# Patient Record
Sex: Male | Born: 1975 | Race: White | Hispanic: No | Marital: Married | Smoking: Current every day smoker
Health system: Southern US, Community
[De-identification: ages and names within clinical notes are randomized; demographics above are authoritative.]

## PROBLEM LIST (undated history)

## (undated) DIAGNOSIS — G5600 Carpal tunnel syndrome, unspecified upper limb: Secondary | ICD-10-CM

## (undated) DIAGNOSIS — M1990 Unspecified osteoarthritis, unspecified site: Secondary | ICD-10-CM

## (undated) DIAGNOSIS — M719 Bursopathy, unspecified: Secondary | ICD-10-CM

## (undated) DIAGNOSIS — G8929 Other chronic pain: Secondary | ICD-10-CM

## (undated) DIAGNOSIS — M549 Dorsalgia, unspecified: Secondary | ICD-10-CM

---

## 2012-11-29 ENCOUNTER — Emergency Department (HOSPITAL_COMMUNITY)
Admission: EM | Admit: 2012-11-29 | Discharge: 2012-11-29 | Disposition: A | Discharge status: Home / Self Care | Payer: Self-pay | Attending: Emergency Medicine | Admitting: Emergency Medicine

## 2012-11-29 ENCOUNTER — Encounter (HOSPITAL_COMMUNITY): Payer: Self-pay | Admitting: *Deleted

## 2012-11-29 ENCOUNTER — Emergency Department (HOSPITAL_COMMUNITY): Payer: Self-pay

## 2012-11-29 DIAGNOSIS — G5600 Carpal tunnel syndrome, unspecified upper limb: Secondary | ICD-10-CM | POA: Insufficient documentation

## 2012-11-29 DIAGNOSIS — F17200 Nicotine dependence, unspecified, uncomplicated: Secondary | ICD-10-CM | POA: Insufficient documentation

## 2012-11-29 DIAGNOSIS — M79609 Pain in unspecified limb: Secondary | ICD-10-CM | POA: Insufficient documentation

## 2012-11-29 DIAGNOSIS — IMO0002 Reserved for concepts with insufficient information to code with codable children: Secondary | ICD-10-CM | POA: Insufficient documentation

## 2012-11-29 DIAGNOSIS — G8929 Other chronic pain: Secondary | ICD-10-CM | POA: Insufficient documentation

## 2012-11-29 DIAGNOSIS — Z8739 Personal history of other diseases of the musculoskeletal system and connective tissue: Secondary | ICD-10-CM | POA: Insufficient documentation

## 2012-11-29 DIAGNOSIS — M5416 Radiculopathy, lumbar region: Secondary | ICD-10-CM

## 2012-11-29 HISTORY — DX: Other chronic pain: G89.29

## 2012-11-29 HISTORY — DX: Unspecified osteoarthritis, unspecified site: M19.90

## 2012-11-29 HISTORY — DX: Carpal tunnel syndrome, unspecified upper limb: G56.00

## 2012-11-29 HISTORY — DX: Dorsalgia, unspecified: M54.9

## 2012-11-29 HISTORY — DX: Bursopathy, unspecified: M71.9

## 2012-11-29 MED ORDER — IBUPROFEN 800 MG PO TABS
800.0000 mg | ORAL_TABLET | Freq: Once | ORAL | Status: AC
  Administered 2012-11-29: 800 mg via ORAL
  Filled 2012-11-29: qty 1

## 2012-11-29 MED ORDER — CYCLOBENZAPRINE HCL 10 MG PO TABS: 10.0000 mg | ORAL_TABLET | Freq: Three times a day (TID) | ORAL | Status: DC | PRN

## 2012-11-29 MED ORDER — OXYCODONE-ACETAMINOPHEN 5-325 MG PO TABS
1.0000 | ORAL_TABLET | Freq: Once | ORAL | Status: AC
  Administered 2012-11-29: 1 via ORAL
  Filled 2012-11-29: qty 1

## 2012-11-29 MED ORDER — PREDNISONE 10 MG PO TABS: ORAL_TABLET | ORAL | Status: DC

## 2012-11-29 MED ORDER — HYDROCODONE-ACETAMINOPHEN 5-325 MG PO TABS: ORAL_TABLET | ORAL | Status: DC

## 2012-11-29 NOTE — ED Notes (Signed)
Pt reports lower back pain radiating down left leg for past 2- 2 1/2 weeks.  Reports has problems with lower back pain intermittently.  Says pain started after hanging sheet rock.  Denies any bowel or urinary incontinence.

## 2012-11-29 NOTE — ED Notes (Signed)
Pt states that he has hx of chronic lower back due to injury 3-4 years ago, last 4 days pain has increased and radiates down left leg, pt ambulatory to triage with no distress noted, denies any problems with bowel or bladder. Denies any new injury.

## 2012-11-29 NOTE — ED Provider Notes (Signed)
History     CSN: [id number]  Arrival date & time 11/29/12  1031   First MD Initiated Contact with Patient 11/29/12 1123      Chief Complaint  Patient presents with  . Back Pain    (Consider location/radiation/quality/duration/timing/severity/associated sxs/prior treatment) HPI Comments: Patient with hx of chronic low back pain c/o worsening pain to same area for 4 days.  States the pain is radiating into his left buttock and down his left leg to his knee.  He denies incontinence, fever, dysuria, numbness or weakness of the LE's or saddle anesthesia's.  Patient is a 37 y.o. male presenting with back pain. The history is provided by the patient.  Back Pain  This is a chronic problem. The current episode started more than 2 days ago. The problem occurs constantly. The problem has not changed since onset.The pain is associated with no known injury. The pain is present in the lumbar spine. The quality of the pain is described as aching and shooting. The pain radiates to the left thigh and left knee. The pain is moderate. The symptoms are aggravated by bending, twisting and certain positions. Associated symptoms include leg pain. Pertinent negatives include no chest pain, no fever, no numbness, no abdominal pain, no abdominal swelling, no bowel incontinence, no perianal numbness, no bladder incontinence, no dysuria, no pelvic pain, no paresthesias, no paresis, no tingling and no weakness. He has tried NSAIDs for the symptoms. The treatment provided no relief.    Past Medical History  Diagnosis Date  . Back pain, chronic   . Arthritis   . Carpal tunnel syndrome   . Bursitis     History reviewed. No pertinent past surgical history.  No family history on file.  History  Substance Use Topics  . Smoking status: Current Every Day Smoker  . Smokeless tobacco: Not on file  . Alcohol Use: No      Review of Systems  Constitutional: Negative for fever.  Respiratory: Negative for shortness  of breath.   Cardiovascular: Negative for chest pain.  Gastrointestinal: Negative for vomiting, abdominal pain, constipation and bowel incontinence.  Genitourinary: Negative for bladder incontinence, dysuria, hematuria, flank pain, decreased urine volume, discharge, scrotal swelling, difficulty urinating, testicular pain and pelvic pain.       No perineal numbness or incontinence of urine or feces  Musculoskeletal: Positive for back pain. Negative for joint swelling.  Skin: Negative for rash.  Neurological: Negative for dizziness, tingling, weakness, numbness and paresthesias.  All other systems reviewed and are negative.    Allergies  Review of patient's allergies indicates no known allergies.  Home Medications   Current Outpatient Rx  Name  Route  Sig  Dispense  Refill  . IBUPROFEN 200 MG PO TABS   Oral   Take 600 mg by mouth every 6 (six) hours as needed. Pain           BP 123/72  Pulse 77  Temp 98.2 F (36.8 C)  Resp 20  Ht 5\' 11"  (1.803 m)  Wt 155 lb (70.308 kg)  BMI 21.62 kg/m2  SpO2 98%  Physical Exam  Nursing note and vitals reviewed. Constitutional: He is oriented to person, place, and time. He appears well-developed and well-nourished. No distress.  HENT:  Head: Normocephalic and atraumatic.  Neck: Normal range of motion. Neck supple.  Cardiovascular: Normal rate, regular rhythm and intact distal pulses.   No murmur heard. Pulmonary/Chest: Effort normal and breath sounds normal.  Musculoskeletal: He exhibits tenderness. He exhibits  no edema.       Lumbar back: He exhibits tenderness and pain. He exhibits normal range of motion, no bony tenderness, no swelling, no deformity, no laceration and normal pulse.       Back:       ttp of the left lumbar paraspinal muscles and SI joint.  DP pulses are strong and equal bilaterally, distal sensation intact.    Neurological: He is alert and oriented to person, place, and time. No cranial nerve deficit or sensory  deficit. He exhibits normal muscle tone. Coordination and gait normal.  Reflex Scores:      Patellar reflexes are 2+ on the right side and 2+ on the left side.      Achilles reflexes are 2+ on the right side and 2+ on the left side. Skin: Skin is warm and dry.    ED Course  Procedures (including critical care time)  Labs Reviewed - No data to display Dg Lumbar Spine Complete  11/29/2012  *RADIOLOGY REPORT*  Clinical Data: Back pain for 4 years with worsened exacerbation, no known injury  LUMBAR SPINE - COMPLETE 4+ VIEW  Comparison: None.  Findings: There are five non-rib bearing lumbar type vertebral bodies.  There is straightening and slight reversal of the expected lumbar lordosis.  No anterolisthesis or retrolisthesis.  No definite pars defects.  Lumbar vertebral body heights are preserved.  There is very mild multilevel DDD, worse at L2 - L3 and L3 - L4 with disc space height loss, end plate irregularity and sclerosis.  Limited visualization of the bilateral SI joints is normal. Several phleboliths overlie the right hemi pelvis.  Regional bowel gas pattern and soft tissues are normal.  IMPRESSION:  1.  Straightening of the expected lumbar lordosis, nonspecific but may be seen in the setting of muscle spasm. 2.  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   Original Report Authenticated By: Tacey Ruiz, MD         MDM    Patient has ttp of the left lumbar paraspinal muscles and SI joint.  No focal neuro deficits on exam.  Ambulates with a steady gait.  Pain reproducible with SLR on left.  Pt has lumbar radicular pain with intermittent numbness to the left leg, no acute neurological deficits today.  Pt agrees to close f/u with his PMD in [place].     prescribed: Prednisone taper Flexeril Norco #20        Henry Golden L. [place], [place] 12/01/12 2138

## 2012-12-03 NOTE — ED Provider Notes (Signed)
Medical screening examination/treatment/procedure(s) were performed by non-physician practitioner and as supervising physician I was immediately available for consultation/collaboration.   Toshiye Kever, MD 12/03/12 1441 

## 2013-03-27 ENCOUNTER — Encounter (HOSPITAL_COMMUNITY): Payer: Self-pay

## 2013-03-27 ENCOUNTER — Emergency Department (HOSPITAL_COMMUNITY)
Admission: EM | Admit: 2013-03-27 | Discharge: 2013-03-27 | Disposition: A | Discharge status: Home / Self Care | Payer: Self-pay | Attending: Emergency Medicine | Admitting: Emergency Medicine

## 2013-03-27 DIAGNOSIS — M25559 Pain in unspecified hip: Secondary | ICD-10-CM | POA: Insufficient documentation

## 2013-03-27 DIAGNOSIS — Z8739 Personal history of other diseases of the musculoskeletal system and connective tissue: Secondary | ICD-10-CM | POA: Insufficient documentation

## 2013-03-27 DIAGNOSIS — M5430 Sciatica, unspecified side: Secondary | ICD-10-CM | POA: Insufficient documentation

## 2013-03-27 DIAGNOSIS — G8929 Other chronic pain: Secondary | ICD-10-CM | POA: Insufficient documentation

## 2013-03-27 DIAGNOSIS — F17200 Nicotine dependence, unspecified, uncomplicated: Secondary | ICD-10-CM | POA: Insufficient documentation

## 2013-03-27 DIAGNOSIS — M5432 Sciatica, left side: Secondary | ICD-10-CM

## 2013-03-27 DIAGNOSIS — Z8669 Personal history of other diseases of the nervous system and sense organs: Secondary | ICD-10-CM | POA: Insufficient documentation

## 2013-03-27 MED ORDER — KETOROLAC TROMETHAMINE 60 MG/2ML IM SOLN
60.0000 mg | Freq: Once | INTRAMUSCULAR | Status: DC
  Filled 2013-03-27: qty 2

## 2013-03-27 MED ORDER — MELOXICAM 7.5 MG PO TABS: ORAL_TABLET | ORAL | Status: DC

## 2013-03-27 MED ORDER — ACETAMINOPHEN-CODEINE #3 300-30 MG PO TABS: 1.0000 | ORAL_TABLET | Freq: Four times a day (QID) | ORAL | Status: AC | PRN

## 2013-03-27 MED ORDER — DEXAMETHASONE SODIUM PHOSPHATE 4 MG/ML IJ SOLN
8.0000 mg | Freq: Once | INTRAMUSCULAR | Status: AC
  Administered 2013-03-27: 8 mg via INTRAMUSCULAR
  Filled 2013-03-27 (×2): qty 2

## 2013-03-27 MED ORDER — ONDANSETRON HCL 4 MG PO TABS
4.0000 mg | ORAL_TABLET | Freq: Once | ORAL | Status: AC
  Administered 2013-03-27: 4 mg via ORAL
  Filled 2013-03-27: qty 1

## 2013-03-27 MED ORDER — FENTANYL CITRATE 0.05 MG/ML IJ SOLN
100.0000 ug | Freq: Once | INTRAMUSCULAR | Status: AC
  Administered 2013-03-27: 100 ug via INTRAMUSCULAR
  Filled 2013-03-27: qty 2

## 2013-03-27 MED ORDER — KETOROLAC TROMETHAMINE 10 MG PO TABS
10.0000 mg | ORAL_TABLET | Freq: Once | ORAL | Status: AC
  Administered 2013-03-27: 10 mg via ORAL
  Filled 2013-03-27: qty 1

## 2013-03-27 MED ORDER — BACLOFEN 10 MG PO TABS: 10.0000 mg | ORAL_TABLET | Freq: Three times a day (TID) | ORAL | Status: AC

## 2013-03-27 NOTE — ED Provider Notes (Signed)
History     CSN: [id number]  Arrival date & time 03/27/13  0920   First MD Initiated Contact with Patient 03/27/13 [phone]      Chief Complaint  Patient presents with  . Back Pain  . Hip Pain    (Consider location/radiation/quality/duration/timing/severity/associated sxs/prior treatment) Patient is a 37 y.o. male presenting with back pain and hip pain. The history is provided by the patient.  Back Pain Location:  Lumbar spine Quality:  Aching Radiates to: left hip. Pain severity:  Severe Pain is:  Same all the time Onset quality:  Gradual Duration:  1 week Timing:  Constant Progression:  Worsening Chronicity:  Chronic Context comment:  Hx of ddd. No recent accidents or trauma Relieved by:  Nothing Worsened by:  Movement (certain possitions.) Ineffective treatments:  NSAIDs Associated symptoms: no abdominal pain, no bladder incontinence, no bowel incontinence, no chest pain, no dysuria and no perianal numbness   Hip Pain Associated symptoms include arthralgias. Pertinent negatives include no abdominal pain, chest pain, coughing or neck pain.    Past Medical History  Diagnosis Date  . Back pain, chronic   . Arthritis   . Carpal tunnel syndrome   . Bursitis     History reviewed. No pertinent past surgical history.  No family history on file.  History  Substance Use Topics  . Smoking status: Current Every Day Smoker  . Smokeless tobacco: Not on file  . Alcohol Use: No      Review of Systems  Constitutional: Negative for activity change.       All ROS Neg except as noted in HPI  HENT: Negative for nosebleeds and neck pain.   Eyes: Negative for photophobia and discharge.  Respiratory: Negative for cough, shortness of breath and wheezing.   Cardiovascular: Negative for chest pain and palpitations.  Gastrointestinal: Negative for abdominal pain, blood in stool and bowel incontinence.  Genitourinary: Negative for bladder incontinence, dysuria, frequency and  hematuria.  Musculoskeletal: Positive for back pain and arthralgias.  Skin: Negative.   Neurological: Negative for dizziness, seizures and speech difficulty.  Psychiatric/Behavioral: Negative for hallucinations and confusion.    Allergies  Hydrocodone  Home Medications   Current Outpatient Rx  Name  Route  Sig  Dispense  Refill  . ibuprofen (ADVIL,MOTRIN) 200 MG tablet   Oral   Take 600 mg by mouth every 6 (six) hours as needed. Pain         . naproxen (NAPROSYN) 250 MG tablet   Oral   Take 250 mg by mouth 2 (two) times daily with a meal.           BP 130/75  Pulse 74  Temp(Src) 98.5 F (36.9 C) (Oral)  Resp 18  Ht 5\' 11"  (1.803 m)  Wt 155 lb (70.308 kg)  BMI 21.63 kg/m2  SpO2 100%  Physical Exam  Nursing note and vitals reviewed. Constitutional: He is oriented to person, place, and time. He appears well-developed and well-nourished.  Non-toxic appearance.  HENT:  Head: Normocephalic.  Right Ear: Tympanic membrane and external ear normal.  Left Ear: Tympanic membrane and external ear normal.  Eyes: EOM and lids are normal. Pupils are equal, round, and reactive to light.  Neck: Normal range of motion. Neck supple. Carotid bruit is not present.  Cardiovascular: Normal rate, regular rhythm, normal heart sounds, intact distal pulses and normal pulses.   Pulmonary/Chest: Breath sounds normal. No respiratory distress.  Abdominal: Soft. Bowel sounds are normal. There is no tenderness. There is  no guarding.  Musculoskeletal: Normal range of motion.  Lumbar area pain with sitting and worse with SLR at 40 degrees. No hot areas of the pack. No palpable step off.  Lymphadenopathy:       Head (right side): No submandibular adenopathy present.       Head (left side): No submandibular adenopathy present.    He has no cervical adenopathy.  Neurological: He is alert and oriented to person, place, and time. He has normal strength. No cranial nerve deficit or sensory deficit.   No motor or sensory deficits.  Skin: Skin is warm and dry.  Psychiatric: He has a normal mood and affect. His speech is normal.    ED Course  Procedures (including critical care time)  Labs Reviewed - No data to display No results found.   No diagnosis found.    MDM  I have reviewed nursing notes, vital signs, and all appropriate lab and imaging results for this patient. Pt has chronic back problem that worsened nearly 1 week ago. Pt has been taking motrin and naproxyn which usually makes the pain livable, but not working this time. Pt also has pain going down to the hip and leg on the left. No loss of bowel or bladder function.  Plan - Rx for baclofen, tylenol #3 and mobic given to the patient. Pt referred to Dr Ave Filter for evaluation of the extending back problem.       Kathie Dike, PA-C 03/27/13 1054

## 2013-03-27 NOTE — ED Notes (Signed)
Pt c/o chronic pain in lower back, left hip, and radiating into left leg.  Reports pain has been worse since Tuesday.

## 2013-03-30 NOTE — ED Provider Notes (Signed)
Medical screening examination/treatment/procedure(s) were performed by non-physician practitioner and as supervising physician I was immediately available for consultation/collaboration.   Jeily Guthridge M Graycen Degan, DO 03/30/13 1113 

## 2013-04-04 ENCOUNTER — Other Ambulatory Visit (HOSPITAL_COMMUNITY): Payer: Self-pay | Admitting: Family Medicine

## 2013-04-04 DIAGNOSIS — R52 Pain, unspecified: Secondary | ICD-10-CM

## 2013-04-10 ENCOUNTER — Ambulatory Visit (HOSPITAL_COMMUNITY)
Admission: RE | Admit: 2013-04-10 | Discharge: 2013-04-10 | Disposition: A | Discharge status: Home / Self Care | Payer: Self-pay | Source: Ambulatory Visit | Attending: Family Medicine | Admitting: Family Medicine

## 2013-04-10 DIAGNOSIS — R52 Pain, unspecified: Secondary | ICD-10-CM

## 2013-04-11 ENCOUNTER — Ambulatory Visit (HOSPITAL_COMMUNITY)
Admission: RE | Admit: 2013-04-11 | Discharge: 2013-04-11 | Disposition: A | Discharge status: Home / Self Care | Payer: Self-pay | Source: Ambulatory Visit | Attending: Family Medicine | Admitting: Family Medicine

## 2013-04-11 DIAGNOSIS — M5126 Other intervertebral disc displacement, lumbar region: Secondary | ICD-10-CM | POA: Insufficient documentation

## 2013-04-11 DIAGNOSIS — M545 Low back pain, unspecified: Secondary | ICD-10-CM | POA: Insufficient documentation

## 2013-04-17 ENCOUNTER — Other Ambulatory Visit: Payer: Self-pay | Admitting: Family Medicine

## 2013-04-17 DIAGNOSIS — M549 Dorsalgia, unspecified: Secondary | ICD-10-CM

## 2013-04-25 ENCOUNTER — Ambulatory Visit
Admission: RE | Admit: 2013-04-25 | Discharge: 2013-04-25 | Disposition: A | Discharge status: Home / Self Care | Payer: No Typology Code available for payment source | Source: Ambulatory Visit | Attending: Family Medicine | Admitting: Family Medicine

## 2013-04-25 ENCOUNTER — Other Ambulatory Visit: Payer: Self-pay | Admitting: Family Medicine

## 2013-04-25 DIAGNOSIS — M549 Dorsalgia, unspecified: Secondary | ICD-10-CM

## 2013-04-25 MED ORDER — METHYLPREDNISOLONE ACETATE 40 MG/ML INJ SUSP (RADIOLOG
120.0000 mg | Freq: Once | INTRAMUSCULAR | Status: AC
  Administered 2013-04-25: 120 mg via EPIDURAL

## 2013-04-25 MED ORDER — IOHEXOL 180 MG/ML  SOLN
1.0000 mL | Freq: Once | INTRAMUSCULAR | Status: AC | PRN
  Administered 2013-04-25: 1 mL via EPIDURAL

## 2014-09-10 ENCOUNTER — Other Ambulatory Visit: Payer: Self-pay | Admitting: Family Medicine

## 2014-09-10 DIAGNOSIS — M545 Low back pain: Principal | ICD-10-CM

## 2014-09-10 DIAGNOSIS — G8929 Other chronic pain: Secondary | ICD-10-CM

## 2014-09-10 DIAGNOSIS — M5136 Other intervertebral disc degeneration, lumbar region: Secondary | ICD-10-CM

## 2014-09-12 ENCOUNTER — Ambulatory Visit
Admission: RE | Admit: 2014-09-12 | Discharge: 2014-09-12 | Disposition: A | Discharge status: Home / Self Care | Payer: No Typology Code available for payment source | Source: Ambulatory Visit | Attending: Family Medicine | Admitting: Family Medicine

## 2014-09-12 ENCOUNTER — Other Ambulatory Visit: Payer: Self-pay | Admitting: Family Medicine

## 2014-09-12 DIAGNOSIS — M5136 Other intervertebral disc degeneration, lumbar region: Secondary | ICD-10-CM

## 2014-09-12 DIAGNOSIS — M545 Low back pain, unspecified: Secondary | ICD-10-CM

## 2014-09-12 DIAGNOSIS — G8929 Other chronic pain: Secondary | ICD-10-CM

## 2014-09-12 MED ORDER — IOHEXOL 180 MG/ML  SOLN
1.0000 mL | Freq: Once | INTRAMUSCULAR | Status: AC | PRN
  Administered 2014-09-12: 1 mL via EPIDURAL

## 2014-09-12 MED ORDER — METHYLPREDNISOLONE ACETATE 40 MG/ML INJ SUSP (RADIOLOG
120.0000 mg | Freq: Once | INTRAMUSCULAR | Status: AC
  Administered 2014-09-12: 120 mg via EPIDURAL

## 2014-09-12 NOTE — Discharge Instructions (Signed)

## 2016-01-26 ENCOUNTER — Other Ambulatory Visit: Payer: Self-pay | Admitting: Family Medicine

## 2016-01-26 DIAGNOSIS — G8929 Other chronic pain: Secondary | ICD-10-CM

## 2016-01-26 DIAGNOSIS — M545 Low back pain: Principal | ICD-10-CM

## 2016-02-03 ENCOUNTER — Ambulatory Visit
Admission: RE | Admit: 2016-02-03 | Discharge: 2016-02-03 | Disposition: A | Discharge status: Home / Self Care | Payer: No Typology Code available for payment source | Source: Ambulatory Visit | Attending: Family Medicine | Admitting: Family Medicine

## 2016-02-03 DIAGNOSIS — M545 Low back pain, unspecified: Secondary | ICD-10-CM

## 2016-02-03 DIAGNOSIS — G8929 Other chronic pain: Secondary | ICD-10-CM

## 2016-02-03 MED ORDER — IOHEXOL 180 MG/ML  SOLN
1.0000 mL | Freq: Once | INTRAMUSCULAR | Status: AC | PRN
  Administered 2016-02-03: 1 mL via EPIDURAL

## 2016-02-03 MED ORDER — METHYLPREDNISOLONE ACETATE 40 MG/ML INJ SUSP (RADIOLOG
120.0000 mg | Freq: Once | INTRAMUSCULAR | Status: AC
  Administered 2016-02-03: 120 mg via EPIDURAL

## 2016-02-03 NOTE — Discharge Instructions (Signed)

## 2017-01-05 ENCOUNTER — Other Ambulatory Visit: Payer: Self-pay | Admitting: Family Medicine

## 2017-01-05 DIAGNOSIS — G8929 Other chronic pain: Secondary | ICD-10-CM

## 2017-01-05 DIAGNOSIS — M545 Low back pain: Principal | ICD-10-CM

## 2020-05-07 ENCOUNTER — Emergency Department (HOSPITAL_COMMUNITY)
Admission: EM | Admit: 2020-05-07 | Discharge: 2020-05-07 | Disposition: A | Discharge status: Home / Self Care | Payer: Self-pay | Attending: Emergency Medicine | Admitting: Emergency Medicine

## 2020-05-07 ENCOUNTER — Emergency Department (HOSPITAL_COMMUNITY): Payer: Self-pay

## 2020-05-07 ENCOUNTER — Encounter (HOSPITAL_COMMUNITY): Payer: Self-pay | Admitting: *Deleted

## 2020-05-07 ENCOUNTER — Other Ambulatory Visit: Payer: Self-pay

## 2020-05-07 DIAGNOSIS — F17200 Nicotine dependence, unspecified, uncomplicated: Secondary | ICD-10-CM | POA: Insufficient documentation

## 2020-05-07 DIAGNOSIS — M1712 Unilateral primary osteoarthritis, left knee: Secondary | ICD-10-CM | POA: Insufficient documentation

## 2020-05-07 MED ORDER — MELOXICAM 7.5 MG PO TABS
7.5000 mg | ORAL_TABLET | Freq: Every day | ORAL | 0 refills | Status: AC
Start: 2020-05-07 — End: 2020-05-17

## 2020-05-07 NOTE — Discharge Instructions (Addendum)
Take meloxicam daily as prescribed with food. Alternate warm and cold compresses to the knee. Follow-up with orthopedics if pain persist.

## 2020-05-07 NOTE — ED Triage Notes (Signed)
Left knee pain x 4 days.

## 2020-05-07 NOTE — ED Provider Notes (Signed)
[facility] EMERGENCY DEPARTMENT Provider Note   CSN: [id number] Arrival date & time: 05/07/20  0930     History Chief Complaint  Patient presents with  . Knee Pain    Percy Winterrowd is a 44 y.o. male.  44yo male with complaint of left knee pain. Patient states he was working the other day when his knee locked up on him, unable to move the knee without pain. Patient was able to work this out and is able to flex and extend the knee at this time however reports ongoing aching pain in the posterior knee. Patient has been wearing a knee sleeve with some relief. Denies swelling, no recent or remote injuries to this knee. No other complaints or concerns.         Past Medical History:  Diagnosis Date  . Arthritis   . Back pain, chronic   . Bursitis   . Carpal tunnel syndrome     There are no problems to display for this patient.   History reviewed. No pertinent surgical history.     No family history on file.  Social History   Tobacco Use  . Smoking status: Current Every Day Smoker  . Smokeless tobacco: Never Used  Substance Use Topics  . Alcohol use: No  . Drug use: No    Home Medications Prior to Admission medications   Medication Sig Start Date End Date Taking? Authorizing Provider  acetaminophen-codeine (TYLENOL #3) 300-30 MG per tablet Take 1-2 tablets by mouth every 6 (six) hours as needed for pain. 03/27/13   Ivery Quale, PA-C  baclofen (LIORESAL) 10 MG tablet Take 1 tablet (10 mg total) by mouth 3 (three) times daily. 03/27/13   Ivery Quale, PA-C  meloxicam (MOBIC) 7.5 MG tablet Take 1 tablet (7.5 mg total) by mouth daily for 10 days. 05/07/20 05/17/20  Jeannie Fend, PA-C    Allergies    Hydrocodone  Review of Systems   Review of Systems  Constitutional: Negative for fever.  Musculoskeletal: Positive for arthralgias and myalgias. Negative for gait problem and joint swelling.  Skin: Negative for color change, rash and wound.  Allergic/Immunologic:  Negative for immunocompromised state.  Neurological: Negative for weakness and numbness.    Physical Exam Updated Vital Signs BP 113/86   Pulse 71   Temp 98 F (36.7 C)   Resp 18   Ht 5' 10.5" (1.791 m)   Wt 79.4 kg   SpO2 100%   BMI 24.75 kg/m   Physical Exam Vitals and nursing note reviewed.  Constitutional:      General: He is not in acute distress.    Appearance: He is well-developed. He is not diaphoretic.  HENT:     Head: Normocephalic and atraumatic.  Cardiovascular:     Pulses: Normal pulses.  Pulmonary:     Effort: Pulmonary effort is normal.  Musculoskeletal:        General: Tenderness present. No swelling or deformity. Normal range of motion.       Legs:     Comments: Mild tenderness to posterior left knee, no calf swelling or tenderness. No erythema, no effusion. Minimal joint line tenderness.   Skin:    General: Skin is warm and dry.     Capillary Refill: Capillary refill takes less than 2 seconds.     Findings: No erythema or rash.  Neurological:     Mental Status: He is alert and oriented to person, place, and time.     Sensory: No sensory deficit.  Psychiatric:        Behavior: Behavior normal.     ED Results / Procedures / Treatments   Labs (all labs ordered are listed, but only abnormal results are displayed) Labs Reviewed - No data to display  EKG None  Radiology DG Knee Complete 4 Views Left  Result Date: 05/07/2020 CLINICAL DATA:  Felt a pop in LEFT knee while working 2 days ago, pain and limited range of motion since, works in [employer], on his feet a lot, initial encounter EXAM: LEFT KNEE - COMPLETE 4+ VIEW COMPARISON:  None FINDINGS: Osseous mineralization normal. Mild medial compartment joint space narrowing. No acute fracture, dislocation, or bone destruction. No joint effusion. IMPRESSION: Minimal degenerative changes at. No acute abnormalities. Electronically Signed   By: Lavonia Dana M.D.   On: 05/07/2020 11:40     Procedures Procedures (including critical care time)  Medications Ordered in ED Medications - No data to display  ED Course  I have reviewed the triage vital signs and the nursing notes.  Pertinent labs & imaging results that were available during my care of the patient were reviewed by me and considered in my medical decision making (see chart for details).  Clinical Course as of May 07 1200  Thu May 08, 8359  3035 44 year old male with complaint of left knee pain as documented above.  On exam does not have any lower extremity swelling no calf tenderness to suspect DVT.  Has minimal joint line tenderness, does have mild tenderness to the posterior left knee.  X-ray shows mild osteoarthritis.  Patient will continue to wear sleeve as needed for comfort, will place on daily course of meloxicam x10 days and recommend follow-up with orthopedics if not improving.  Discussed history of feeling like his knee was locked, discussed this may be related to a meniscus injury which is not visible on x-ray and best to further evaluate by orthopedics.   [LM]    Clinical Course User Index [LM] Roque Lias   MDM Rules/Calculators/A&P                          Final Clinical Impression(s) / ED Diagnoses Final diagnoses:  Osteoarthritis of left knee, unspecified osteoarthritis type    Rx / DC Orders ED Discharge Orders         Ordered    meloxicam (MOBIC) 7.5 MG tablet  Daily     Discontinue  Reprint     05/07/20 1153           Tacy Learn, PA-C 05/07/20 1201    Daleen Bo, MD 05/07/20 1705

## 2020-05-26 ENCOUNTER — Telehealth: Payer: Self-pay | Admitting: Orthopedic Surgery

## 2020-05-26 NOTE — Telephone Encounter (Signed)
Patient inquiring about fees for self-pay following emergency room visit on 05/07/20. Discussed and offered appointment. Patient will call back when ready to schedule.

## 2021-11-24 ENCOUNTER — Other Ambulatory Visit: Payer: Self-pay | Admitting: Neurosurgery

## 2021-11-24 ENCOUNTER — Other Ambulatory Visit (HOSPITAL_COMMUNITY): Payer: Self-pay | Admitting: Neurosurgery

## 2021-11-24 DIAGNOSIS — M5440 Lumbago with sciatica, unspecified side: Secondary | ICD-10-CM

## 2021-12-07 ENCOUNTER — Ambulatory Visit (HOSPITAL_COMMUNITY)
Admission: RE | Admit: 2021-12-07 | Discharge: 2021-12-07 | Disposition: A | Discharge status: Home / Self Care | Payer: Self-pay | Source: Ambulatory Visit | Attending: Neurosurgery | Admitting: Neurosurgery

## 2021-12-07 ENCOUNTER — Other Ambulatory Visit: Payer: Self-pay

## 2021-12-07 DIAGNOSIS — M5440 Lumbago with sciatica, unspecified side: Secondary | ICD-10-CM | POA: Insufficient documentation

## 2022-06-30 IMAGING — MR MR LUMBAR SPINE W/O CM
5 series · 32 of 48 positions shown · non-contrast
Comparison: Lumbar MRI 04/11/2013. Lumbar spine radiographs
11/29/2012 and 10/27/2021.

CLINICAL DATA: Chronic low back pain.  No known injury.

EXAM:
MRI LUMBAR SPINE WITHOUT CONTRAST
TECHNIQUE: Multiplanar, multisequence MR imaging of the lumbar spine was
performed. No intravenous contrast was administered.

[Series 5: STIR · sagittal · 4.0mm · 0.55mm/px · 2 of 16 slices shown]
[im 1/16]
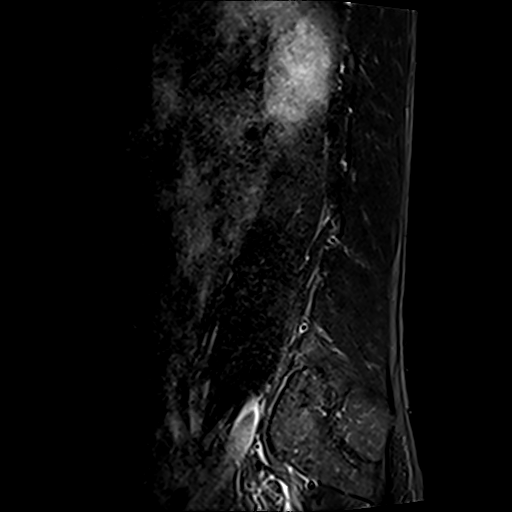
[im 4/16]
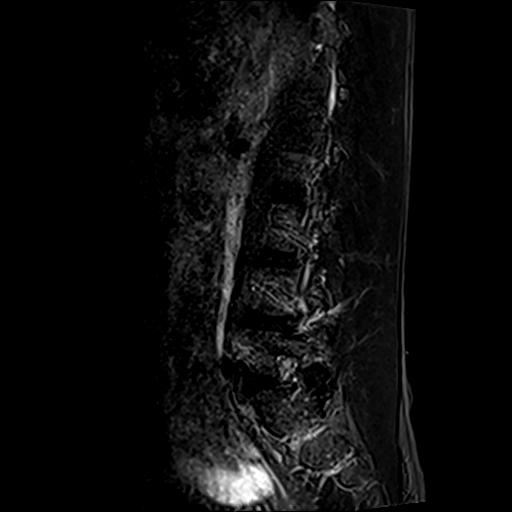

[Series 6: T2 · sagittal · 4.0mm · 0.88mm/px · 6 of 16 slices shown (1 of 2)]
[im 1/16]
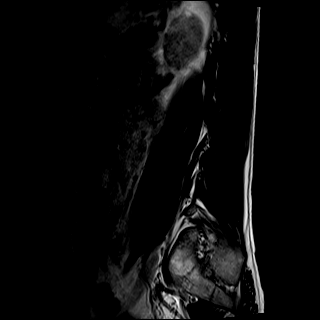
[im 4/16]
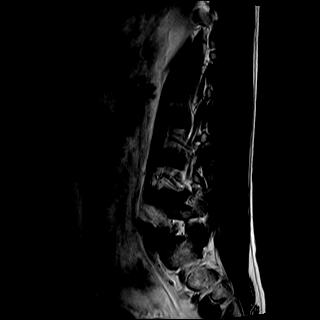
[im 7/16]
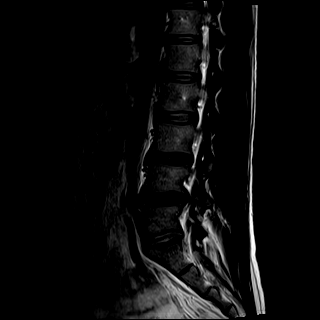
[im 10/16]
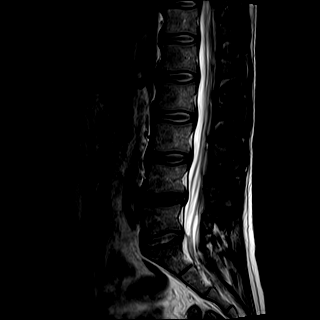
[im 13/16]
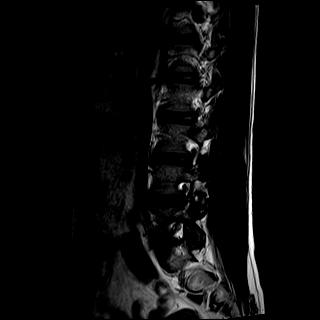
[im 16/16]
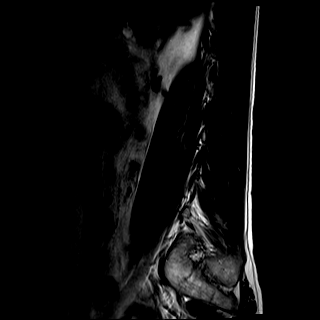

[Series 7: T1 · sagittal · 4.0mm · 1.09mm/px · 6 of 16 slices shown (1 of 2)]
[im 1/16]
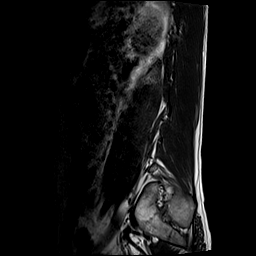
[im 4/16]
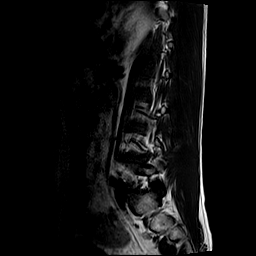
[im 7/16]
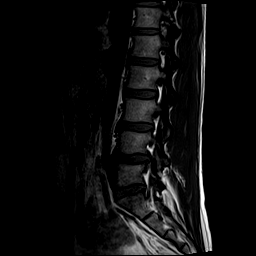
[im 10/16]
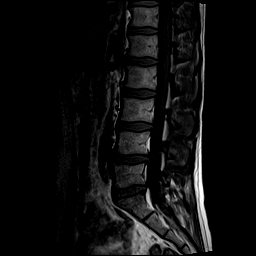
[im 13/16]
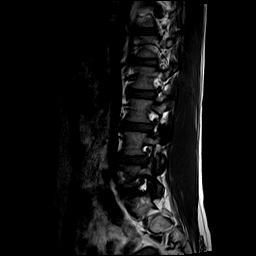
[im 16/16]
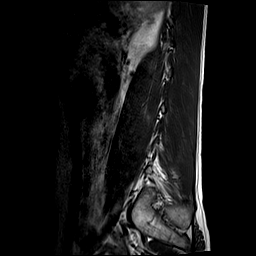

[Series 8: T2 · axial · 4.0mm · 0.70mm/px · z∈[-165,+44]mm · 9 of 37 slices shown (2 of 2)]
[im 1/37]
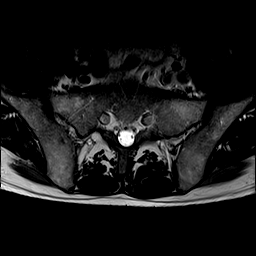
[im 6/37]
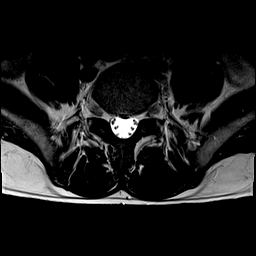
[im 11/37]
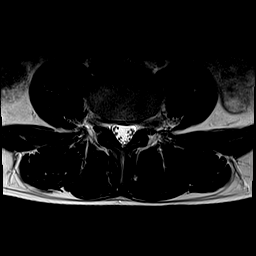
[im 16/37]
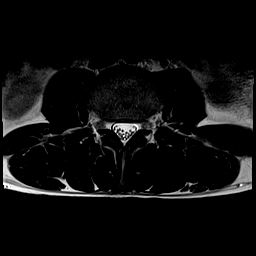
[im 19/37]
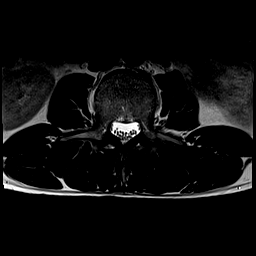
[im 21/37]
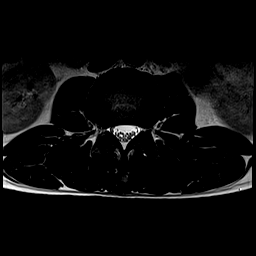
[im 26/37]
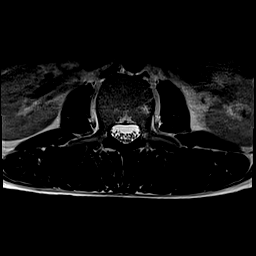
[im 31/37]
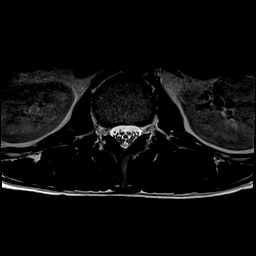
[im 37/37]
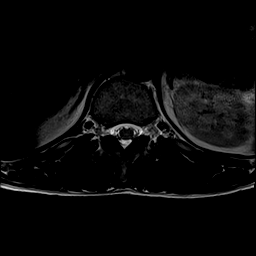

[Series 9: T1 · axial · 4.0mm · 0.35mm/px · z∈[-165,+44]mm · 9 of 37 slices shown (2 of 2)]
[im 1/37]
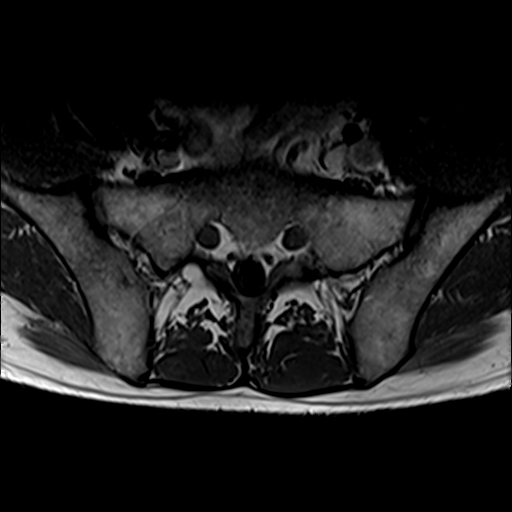
[im 6/37]
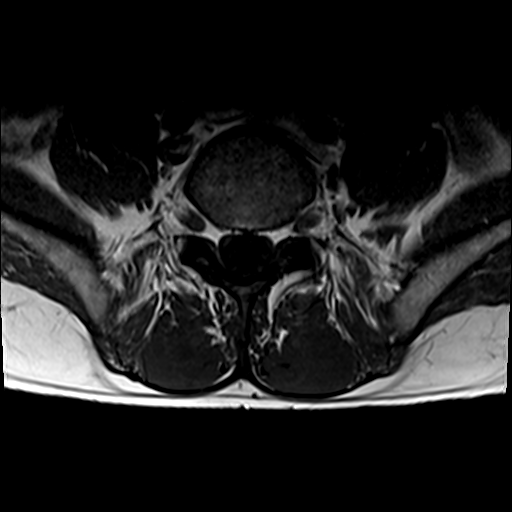
[im 11/37]
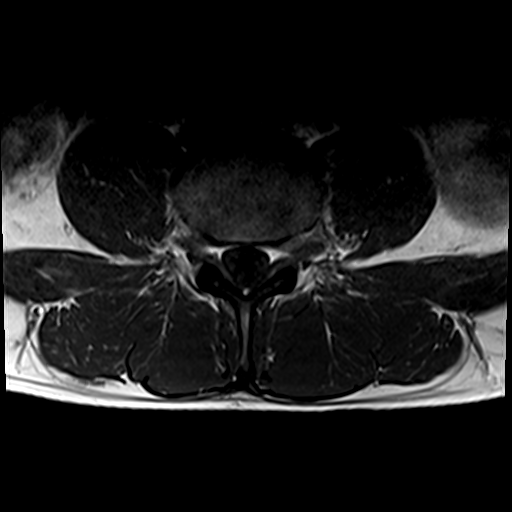
[im 16/37]
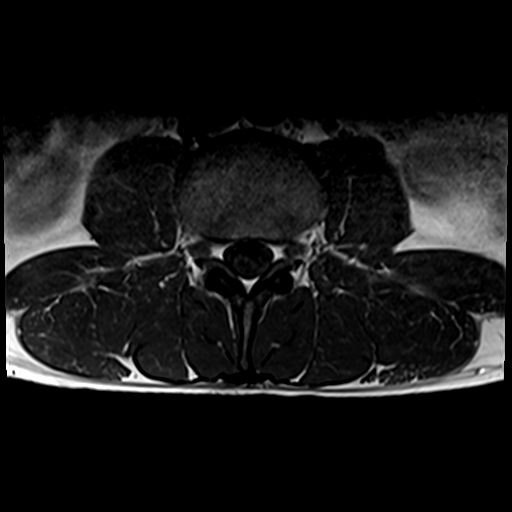
[im 19/37]
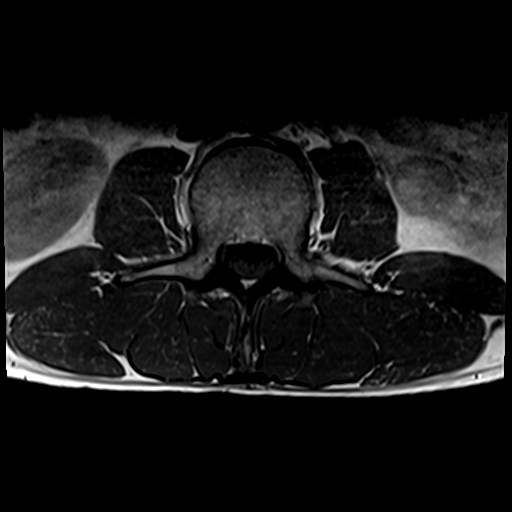
[im 21/37]
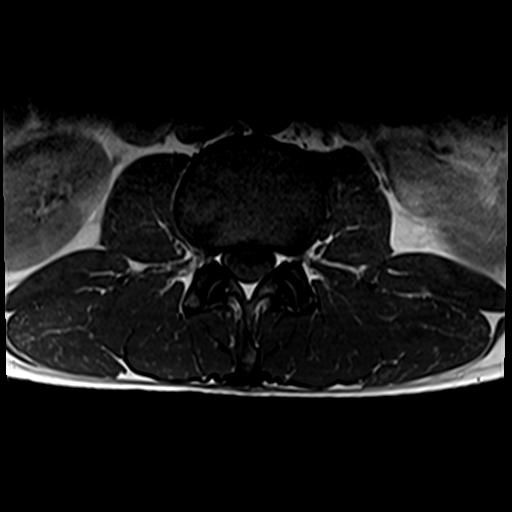
[im 26/37]
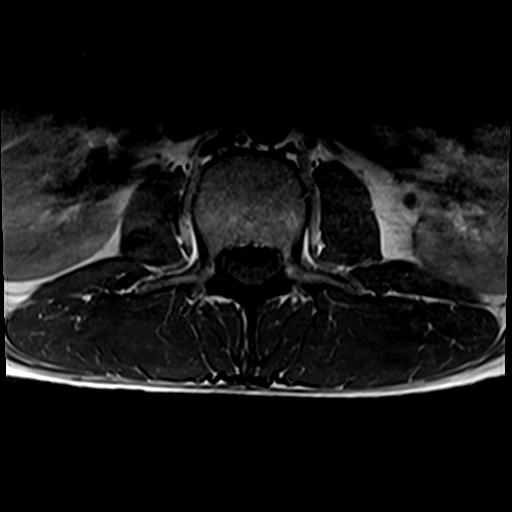
[im 31/37]
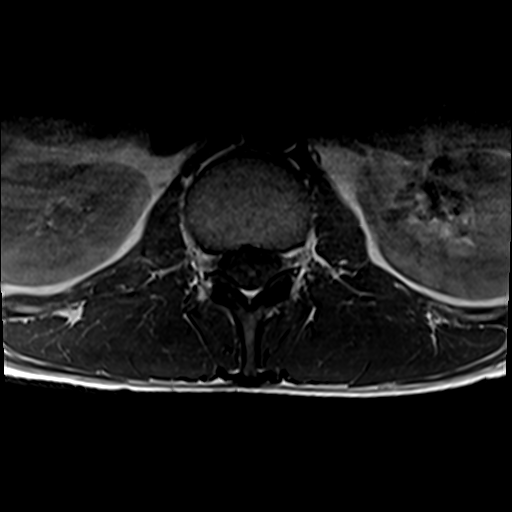
[im 37/37]
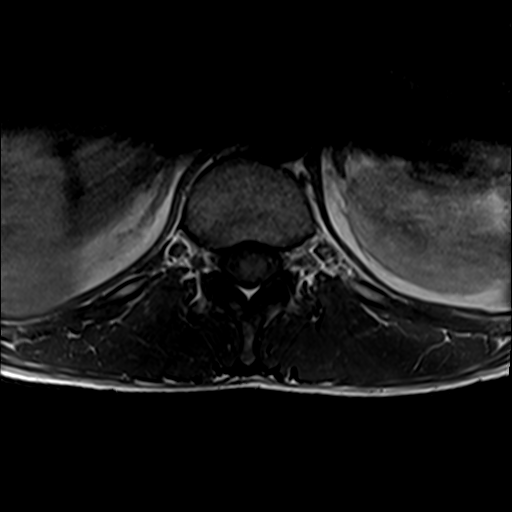

[32 of 48 positions shown; findings below may reference images not displayed]

FINDINGS: Segmentation: Conventional anatomy assumed, with the last open disc
space designated L5-S1.Concordant with previous imaging.

Alignment:  Straightening without focal angulation or listhesis.

Vertebrae: No worrisome osseous lesion, acute fracture or pars
defect. The visualized sacroiliac joints appear unremarkable.

Conus medullaris: Extends to the L1 level and appears normal.

Paraspinal and other soft tissues: No significant paraspinal
findings.

Disc levels:

No significant disc space findings from T12-L1 through L2-3.

L3-4: Mild disc bulging, facet and ligamentous hypertrophy. No
significant spinal stenosis or nerve root encroachment.

L4-5: Chronic loss of disc height with annular disc bulging and a
chronic extraforaminal disc extrusion causing left L4 nerve root
encroachment. This appears similar to previous study. The lateral
recesses and right foramen are adequately patent. Mild facet and
ligamentous hypertrophy.

L5-S1: Disc height and hydration are maintained. Mild bilateral
facet hypertrophy. No spinal stenosis or nerve root encroachment.
IMPRESSION: 1. Chronic extraforaminal disc extrusion on the left at L4-5,
similar to remote MRI from 5017. This could be symptomatic on the
basis of left L4 nerve root encroachment. Correlate with radicular
symptoms.
2. No other significant spinal stenosis or nerve root encroachment.
Mild disc bulging at L3-4. Mild facet degenerative changes.

## 2023-01-12 ENCOUNTER — Encounter: Payer: Self-pay | Admitting: Radiology
